# Patient Record
Sex: Female | Born: 1995 | Race: White | Hispanic: No | Marital: Single | Smoking: Never smoker
Health system: Southern US, Community
[De-identification: ages and names within clinical notes are randomized; demographics above are authoritative.]

## PROBLEM LIST (undated history)

## (undated) ENCOUNTER — Inpatient Hospital Stay (HOSPITAL_COMMUNITY): Payer: Self-pay

## (undated) DIAGNOSIS — Z789 Other specified health status: Secondary | ICD-10-CM

## (undated) HISTORY — PX: NO PAST SURGERIES: SHX2092

---
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
## 2013-01-22 ENCOUNTER — Institutional Professional Consult (permissible substitution) (INDEPENDENT_AMBULATORY_CARE_PROVIDER_SITE_OTHER): Payer: BC Managed Care – PPO | Admitting: Family

## 2013-01-22 DIAGNOSIS — F909 Attention-deficit hyperactivity disorder, unspecified type: Secondary | ICD-10-CM

## 2013-01-22 DIAGNOSIS — F411 Generalized anxiety disorder: Secondary | ICD-10-CM

## 2013-05-20 ENCOUNTER — Institutional Professional Consult (permissible substitution): Payer: BC Managed Care – PPO | Admitting: Family

## 2013-06-17 ENCOUNTER — Institutional Professional Consult (permissible substitution) (INDEPENDENT_AMBULATORY_CARE_PROVIDER_SITE_OTHER): Payer: BC Managed Care – PPO | Admitting: Family

## 2013-06-17 DIAGNOSIS — F909 Attention-deficit hyperactivity disorder, unspecified type: Secondary | ICD-10-CM

## 2013-09-09 ENCOUNTER — Institutional Professional Consult (permissible substitution) (INDEPENDENT_AMBULATORY_CARE_PROVIDER_SITE_OTHER): Payer: BC Managed Care – PPO | Admitting: Family

## 2013-09-09 DIAGNOSIS — F909 Attention-deficit hyperactivity disorder, unspecified type: Secondary | ICD-10-CM

## 2013-12-10 ENCOUNTER — Institutional Professional Consult (permissible substitution): Payer: BC Managed Care – PPO | Admitting: Family

## 2013-12-10 DIAGNOSIS — F909 Attention-deficit hyperactivity disorder, unspecified type: Secondary | ICD-10-CM

## 2014-03-04 ENCOUNTER — Institutional Professional Consult (permissible substitution): Payer: BC Managed Care – PPO | Admitting: Family

## 2014-03-04 DIAGNOSIS — F909 Attention-deficit hyperactivity disorder, unspecified type: Secondary | ICD-10-CM

## 2014-05-17 ENCOUNTER — Institutional Professional Consult (permissible substitution): Payer: BC Managed Care – PPO | Admitting: Family

## 2014-05-17 DIAGNOSIS — F909 Attention-deficit hyperactivity disorder, unspecified type: Secondary | ICD-10-CM

## 2014-08-17 ENCOUNTER — Institutional Professional Consult (permissible substitution): Payer: BC Managed Care – PPO | Admitting: Family

## 2014-08-17 DIAGNOSIS — F900 Attention-deficit hyperactivity disorder, predominantly inattentive type: Secondary | ICD-10-CM

## 2014-10-22 NOTE — L&D Delivery Note (Signed)
Patient was C/C/+3 and pushed for 120 minutes with epidural.    Pt was exhausted but pushed to +4/almost crowning.    Offered VE- all risks and benefits d/w pt and she accepted. VE placed and pulled with one contraction and no popoffs to delivery of head.  SVD  female infant, Apgars 8,9, weight P.   The patient had a 2nd degree laceration repaired with 2-0 vicryl R. Fundus was firm. EBL was expected amount-300cc. Placenta was delivered intact. Vagina was clear.  Baby was vigorous and doing skin to skin with mother.  HORVATH,MICHELLE A

## 2014-11-10 ENCOUNTER — Institutional Professional Consult (permissible substitution): Payer: BLUE CROSS/BLUE SHIELD | Admitting: Family

## 2014-11-10 DIAGNOSIS — F900 Attention-deficit hyperactivity disorder, predominantly inattentive type: Secondary | ICD-10-CM

## 2015-01-11 LAB — OB RESULTS CONSOLE RPR: RPR: NONREACTIVE

## 2015-01-11 LAB — OB RESULTS CONSOLE ANTIBODY SCREEN: Antibody Screen: NEGATIVE

## 2015-01-11 LAB — OB RESULTS CONSOLE HEPATITIS B SURFACE ANTIGEN: HEP B S AG: NEGATIVE

## 2015-01-11 LAB — OB RESULTS CONSOLE ABO/RH: RH TYPE: POSITIVE

## 2015-01-11 LAB — OB RESULTS CONSOLE HIV ANTIBODY (ROUTINE TESTING): HIV: NONREACTIVE

## 2015-01-11 LAB — OB RESULTS CONSOLE RUBELLA ANTIBODY, IGM: Rubella: IMMUNE

## 2015-05-25 ENCOUNTER — Encounter (HOSPITAL_COMMUNITY): Payer: Self-pay | Admitting: *Deleted

## 2015-05-25 ENCOUNTER — Inpatient Hospital Stay (HOSPITAL_COMMUNITY)
Admission: AD | Admit: 2015-05-25 | Discharge: 2015-05-25 | Disposition: A | Discharge status: Home / Self Care | Payer: 59 | Source: Ambulatory Visit | Attending: Obstetrics and Gynecology | Admitting: Obstetrics and Gynecology

## 2015-05-25 DIAGNOSIS — O9989 Other specified diseases and conditions complicating pregnancy, childbirth and the puerperium: Secondary | ICD-10-CM | POA: Insufficient documentation

## 2015-05-25 DIAGNOSIS — Z0371 Encounter for suspected problem with amniotic cavity and membrane ruled out: Secondary | ICD-10-CM | POA: Diagnosis not present

## 2015-05-25 DIAGNOSIS — Z3A33 33 weeks gestation of pregnancy: Secondary | ICD-10-CM | POA: Insufficient documentation

## 2015-05-25 LAB — AMNISURE RUPTURE OF MEMBRANE (ROM) NOT AT ARMC: AMNISURE: NEGATIVE

## 2015-05-25 LAB — URINALYSIS, ROUTINE W REFLEX MICROSCOPIC
Bilirubin Urine: NEGATIVE
Glucose, UA: NEGATIVE mg/dL
Hgb urine dipstick: NEGATIVE
KETONES UR: 15 mg/dL — AB
Leukocytes, UA: NEGATIVE
NITRITE: NEGATIVE
PH: 6 (ref 5.0–8.0)
PROTEIN: NEGATIVE mg/dL
Specific Gravity, Urine: 1.02 (ref 1.005–1.030)
UROBILINOGEN UA: 0.2 mg/dL (ref 0.0–1.0)

## 2015-05-25 NOTE — MAU Provider Note (Signed)
History     CSN: [id number]  Arrival date and time: 05/25/15 2006   First Provider Initiated Contact with Patient 05/25/15 2106      No chief complaint on file.  HPI Comments: Jane Burnett is a 19 y.o. G1P0 at [redacted]w[redacted]d who presents today with leaking of fluid. She states that around 1630 she had a gush of fluid and some time later she had another gush. She has not had any leaking since. She denies any vaginal bleeding. She states that the fetus has been moving normally. She denies any complications with the pregnancy. She has an appointment Friday in the office.    History reviewed. No pertinent past medical history.  History reviewed. No pertinent past surgical history.  Family History  Problem Relation Age of Onset  . Hypertension Maternal Grandfather   . Hypertension Mother   . Hypertension Maternal Grandmother     History  Substance Use Topics  . Smoking status: Never Smoker   . Smokeless tobacco: Never Used  . Alcohol Use: No    Allergies: No Known Allergies  Prescriptions prior to admission  Medication Sig Dispense Refill Last Dose  . acetaminophen (TYLENOL) 325 MG tablet Take 650 mg by mouth every 6 (six) hours as needed for mild pain or headache.   Past Month at Unknown time  . calcium carbonate (TUMS - DOSED IN MG ELEMENTAL CALCIUM) 500 MG chewable tablet Chew 2 tablets by mouth daily as needed for heartburn.   Past Month at Unknown time  . Prenatal Vit-Fe Fumarate-FA (MULTIVITAMIN-PRENATAL) 27-0.8 MG TABS tablet Take 1 tablet by mouth daily at 12 noon.   05/25/2015 at 0530    Review of Systems  Constitutional: Negative for fever.  Gastrointestinal: Negative for nausea, vomiting and abdominal pain.  Genitourinary: Positive for frequency. Negative for dysuria and urgency.   Physical Exam   Blood pressure 127/75, pulse 91, temperature 99.1 F (37.3 C), temperature source Oral, resp. rate 16, height 5\' 6"  (1.676 m), weight 86.183 kg (190 lb), SpO2 98 %.  Physical  Exam  Nursing note and vitals reviewed. Constitutional: She is oriented to person, place, and time. She appears well-developed and well-nourished.  HENT:  Head: Normocephalic.  Respiratory: Effort normal.  GI: Soft. There is no tenderness.  Genitourinary:   Cervix:closed/thick/-3  Neurological: She is alert and oriented to person, place, and time.  Skin: Skin is warm and dry.  Psychiatric: She has a normal mood and affect.   Results for orders placed or performed during the hospital encounter of 05/25/15 (from the past 24 hour(s))  Urinalysis, Routine w reflex microscopic (not at [facility])     Status: Abnormal   Collection Time: 05/25/15  8:20 PM  Result Value Ref Range   Color, Urine YELLOW YELLOW   APPearance CLEAR CLEAR   Specific Gravity, Urine 1.020 1.005 - 1.030   pH 6.0 5.0 - 8.0   Glucose, UA NEGATIVE NEGATIVE mg/dL   Hgb urine dipstick NEGATIVE NEGATIVE   Bilirubin Urine NEGATIVE NEGATIVE   Ketones, ur 15 (A) NEGATIVE mg/dL   Protein, ur NEGATIVE NEGATIVE mg/dL   Urobilinogen, UA 0.2 0.0 - 1.0 mg/dL   Nitrite NEGATIVE NEGATIVE   Leukocytes, UA NEGATIVE NEGATIVE  Amnisure rupture of membrane (rom)not at [facility]     Status: None   Collection Time: 05/25/15  9:13 PM  Result Value Ref Range   Amnisure ROM NEGATIVE    FHT: 150, moderate with 15x15 accels, no decels Toco: no UCs  MAU Course  Procedures  MDM 2151: D/W Dr. Henderson Cloud, ok for DC home.   Assessment and Plan   1. Encounter for suspected premature rupture of membranes, with rupture of membranes not found   2. [redacted] weeks gestation of pregnancy    DC home Comfort measures reviewed  3rd Trimester precautions  PTL precautions  Fetal kick counts RX: none  Return to MAU as needed FU with OB as planned  Follow-up Information    Follow up with Naia Ruff A, MD.   Specialty:  Obstetrics and Gynecology   Why:  As scheduled   Contact information:   [address] [place]  [address] [phone]         [name] 05/25/2015, 9:08 PM

## 2015-05-25 NOTE — Discharge Instructions (Signed)

## 2015-05-25 NOTE — MAU Note (Signed)
Pt reports for the last 3 days she has had some type of fluid leaking out. State today today it has happened more often and a lot more fluid came out. Sometimes has sharp pains in vaginal area. Denies bleeding.

## 2015-06-08 LAB — OB RESULTS CONSOLE GC/CHLAMYDIA
Chlamydia: NEGATIVE
Gonorrhea: NEGATIVE

## 2015-06-08 LAB — OB RESULTS CONSOLE GBS: STREP GROUP B AG: NEGATIVE

## 2015-07-15 ENCOUNTER — Encounter (HOSPITAL_COMMUNITY): Payer: Self-pay | Admitting: *Deleted

## 2015-07-15 ENCOUNTER — Inpatient Hospital Stay (HOSPITAL_COMMUNITY)
Admission: AD | Admit: 2015-07-15 | Discharge: 2015-07-18 | DRG: 775 | Disposition: A | Discharge status: Home / Self Care | Payer: 59 | Source: Ambulatory Visit | Attending: Obstetrics and Gynecology | Admitting: Obstetrics and Gynecology

## 2015-07-15 DIAGNOSIS — O480 Post-term pregnancy: Secondary | ICD-10-CM | POA: Diagnosis not present

## 2015-07-15 DIAGNOSIS — O4292 Full-term premature rupture of membranes, unspecified as to length of time between rupture and onset of labor: Secondary | ICD-10-CM | POA: Diagnosis present

## 2015-07-15 DIAGNOSIS — Z3A40 40 weeks gestation of pregnancy: Secondary | ICD-10-CM | POA: Diagnosis present

## 2015-07-15 HISTORY — DX: Other specified health status: Z78.9

## 2015-07-15 LAB — CBC
HCT: 37.1 % (ref 36.0–46.0)
Hemoglobin: 13.1 g/dL (ref 12.0–15.0)
MCH: 32 pg (ref 26.0–34.0)
MCHC: 35.3 g/dL (ref 30.0–36.0)
MCV: 90.7 fL (ref 78.0–100.0)
PLATELETS: 152 10*3/uL (ref 150–400)
RBC: 4.09 MIL/uL (ref 3.87–5.11)
RDW: 13.3 % (ref 11.5–15.5)
WBC: 12.8 10*3/uL — AB (ref 4.0–10.5)

## 2015-07-15 LAB — TYPE AND SCREEN
ABO/RH(D): A POS
Antibody Screen: NEGATIVE

## 2015-07-15 MED ORDER — LACTATED RINGERS IV SOLN
INTRAVENOUS | Status: DC
  Administered 2015-07-15: 125 mL/h via INTRAVENOUS
  Administered 2015-07-16: 13:00:00 via INTRAVENOUS

## 2015-07-15 NOTE — MAU Note (Signed)
Contractions since 2000. Some bloody show. Cervix 1.5cm last ck.

## 2015-07-16 ENCOUNTER — Encounter (HOSPITAL_COMMUNITY): Payer: Self-pay | Admitting: Emergency Medicine

## 2015-07-16 ENCOUNTER — Inpatient Hospital Stay (HOSPITAL_COMMUNITY): Payer: 59 | Admitting: Anesthesiology

## 2015-07-16 DIAGNOSIS — Z3A40 40 weeks gestation of pregnancy: Secondary | ICD-10-CM | POA: Diagnosis present

## 2015-07-16 DIAGNOSIS — O480 Post-term pregnancy: Secondary | ICD-10-CM | POA: Diagnosis present

## 2015-07-16 DIAGNOSIS — O4292 Full-term premature rupture of membranes, unspecified as to length of time between rupture and onset of labor: Secondary | ICD-10-CM | POA: Diagnosis present

## 2015-07-16 LAB — RPR: RPR Ser Ql: NONREACTIVE

## 2015-07-16 LAB — ABO/RH: ABO/RH(D): A POS

## 2015-07-16 MED ORDER — DIPHENHYDRAMINE HCL 25 MG PO CAPS: 25.0000 mg | ORAL_CAPSULE | Freq: Four times a day (QID) | ORAL | Status: DC | PRN

## 2015-07-16 MED ORDER — ONDANSETRON HCL 4 MG PO TABS: 4.0000 mg | ORAL_TABLET | ORAL | Status: DC | PRN

## 2015-07-16 MED ORDER — DIPHENHYDRAMINE HCL 50 MG/ML IJ SOLN: 12.5000 mg | INTRAMUSCULAR | Status: DC | PRN

## 2015-07-16 MED ORDER — LANOLIN HYDROUS EX OINT: TOPICAL_OINTMENT | CUTANEOUS | Status: DC | PRN

## 2015-07-16 MED ORDER — EPHEDRINE 5 MG/ML INJ
10.0000 mg | INTRAVENOUS | Status: DC | PRN
  Filled 2015-07-16: qty 2

## 2015-07-16 MED ORDER — SODIUM CHLORIDE 0.9 % IV SOLN
2.0000 g | Freq: Once | INTRAVENOUS | Status: AC
  Administered 2015-07-16: 2 g via INTRAVENOUS
  Filled 2015-07-16: qty 2000

## 2015-07-16 MED ORDER — ZOLPIDEM TARTRATE 5 MG PO TABS: 5.0000 mg | ORAL_TABLET | Freq: Every evening | ORAL | Status: DC | PRN

## 2015-07-16 MED ORDER — SODIUM CHLORIDE 0.9 % IJ SOLN: 3.0000 mL | INTRAMUSCULAR | Status: DC | PRN

## 2015-07-16 MED ORDER — SODIUM CHLORIDE 0.9 % IJ SOLN: 3.0000 mL | Freq: Two times a day (BID) | INTRAMUSCULAR | Status: DC

## 2015-07-16 MED ORDER — FLEET ENEMA 7-19 GM/118ML RE ENEM: 1.0000 | ENEMA | RECTAL | Status: DC | PRN

## 2015-07-16 MED ORDER — BUPIVACAINE HCL (PF) 0.25 % IJ SOLN
INTRAMUSCULAR | Status: DC | PRN
  Administered 2015-07-16: 4 mL via EPIDURAL
  Administered 2015-07-16: 6 mL via EPIDURAL
  Administered 2015-07-16: 4 mL via EPIDURAL

## 2015-07-16 MED ORDER — SODIUM CHLORIDE 0.9 % IV SOLN: 250.0000 mL | INTRAVENOUS | Status: DC | PRN

## 2015-07-16 MED ORDER — CITRIC ACID-SODIUM CITRATE 334-500 MG/5ML PO SOLN: 30.0000 mL | ORAL | Status: DC | PRN

## 2015-07-16 MED ORDER — FERROUS SULFATE 325 (65 FE) MG PO TABS
325.0000 mg | ORAL_TABLET | Freq: Two times a day (BID) | ORAL | Status: DC
  Administered 2015-07-17 – 2015-07-18 (×3): 325 mg via ORAL
  Filled 2015-07-16 (×3): qty 1

## 2015-07-16 MED ORDER — IBUPROFEN 800 MG PO TABS
800.0000 mg | ORAL_TABLET | Freq: Three times a day (TID) | ORAL | Status: DC
  Administered 2015-07-16 – 2015-07-18 (×6): 800 mg via ORAL
  Filled 2015-07-16 (×6): qty 1

## 2015-07-16 MED ORDER — FENTANYL 2.5 MCG/ML BUPIVACAINE 1/10 % EPIDURAL INFUSION (WH - ANES)
INTRAMUSCULAR | Status: AC
  Administered 2015-07-16: 14 mL/h via EPIDURAL
  Filled 2015-07-16: qty 125

## 2015-07-16 MED ORDER — OXYCODONE-ACETAMINOPHEN 5-325 MG PO TABS: 2.0000 | ORAL_TABLET | ORAL | Status: DC | PRN

## 2015-07-16 MED ORDER — FENTANYL 2.5 MCG/ML BUPIVACAINE 1/10 % EPIDURAL INFUSION (WH - ANES)
14.0000 mL/h | INTRAMUSCULAR | Status: DC | PRN
  Administered 2015-07-16 (×3): 14 mL/h via EPIDURAL
  Filled 2015-07-16: qty 125

## 2015-07-16 MED ORDER — LACTATED RINGERS IV SOLN: 500.0000 mL | INTRAVENOUS | Status: DC | PRN

## 2015-07-16 MED ORDER — BUTORPHANOL TARTRATE 1 MG/ML IJ SOLN
1.0000 mg | INTRAMUSCULAR | Status: DC | PRN
  Administered 2015-07-16 (×2): 1 mg via INTRAVENOUS
  Filled 2015-07-16: qty 1

## 2015-07-16 MED ORDER — ACETAMINOPHEN 325 MG PO TABS: 650.0000 mg | ORAL_TABLET | ORAL | Status: DC | PRN

## 2015-07-16 MED ORDER — SENNOSIDES-DOCUSATE SODIUM 8.6-50 MG PO TABS
2.0000 | ORAL_TABLET | ORAL | Status: DC
  Administered 2015-07-16 – 2015-07-17 (×2): 2 via ORAL
  Filled 2015-07-16 (×2): qty 2

## 2015-07-16 MED ORDER — MAGNESIUM HYDROXIDE 400 MG/5ML PO SUSP: 30.0000 mL | ORAL | Status: DC | PRN

## 2015-07-16 MED ORDER — BUTORPHANOL TARTRATE 1 MG/ML IJ SOLN
INTRAMUSCULAR | Status: AC
  Filled 2015-07-16: qty 1

## 2015-07-16 MED ORDER — TETANUS-DIPHTH-ACELL PERTUSSIS 5-2.5-18.5 LF-MCG/0.5 IM SUSP: 0.5000 mL | Freq: Once | INTRAMUSCULAR | Status: DC

## 2015-07-16 MED ORDER — OXYTOCIN 40 UNITS IN LACTATED RINGERS INFUSION - SIMPLE MED
62.5000 mL/h | INTRAVENOUS | Status: DC
  Administered 2015-07-16: 62.5 mL/h via INTRAVENOUS
  Filled 2015-07-16: qty 1000

## 2015-07-16 MED ORDER — LIDOCAINE HCL (PF) 1 % IJ SOLN
30.0000 mL | INTRAMUSCULAR | Status: AC | PRN
  Administered 2015-07-16: 30 mL via SUBCUTANEOUS
  Filled 2015-07-16: qty 30

## 2015-07-16 MED ORDER — ONDANSETRON HCL 4 MG/2ML IJ SOLN
4.0000 mg | Freq: Four times a day (QID) | INTRAMUSCULAR | Status: DC | PRN
  Administered 2015-07-16: 4 mg via INTRAVENOUS
  Filled 2015-07-16: qty 2

## 2015-07-16 MED ORDER — FENTANYL CITRATE (PF) 100 MCG/2ML IJ SOLN
INTRAMUSCULAR | Status: AC
  Administered 2015-07-16: 100 ug via EPIDURAL
  Filled 2015-07-16: qty 2

## 2015-07-16 MED ORDER — METHYLERGONOVINE MALEATE 0.2 MG/ML IJ SOLN: 0.2000 mg | INTRAMUSCULAR | Status: DC | PRN

## 2015-07-16 MED ORDER — SIMETHICONE 80 MG PO CHEW: 80.0000 mg | CHEWABLE_TABLET | ORAL | Status: DC | PRN

## 2015-07-16 MED ORDER — PHENYLEPHRINE 40 MCG/ML (10ML) SYRINGE FOR IV PUSH (FOR BLOOD PRESSURE SUPPORT)
80.0000 ug | PREFILLED_SYRINGE | INTRAVENOUS | Status: DC | PRN
  Filled 2015-07-16: qty 2

## 2015-07-16 MED ORDER — MEASLES, MUMPS & RUBELLA VAC ~~LOC~~ INJ
0.5000 mL | INJECTION | Freq: Once | SUBCUTANEOUS | Status: DC
  Filled 2015-07-16: qty 0.5

## 2015-07-16 MED ORDER — DIBUCAINE 1 % RE OINT: 1.0000 "application " | TOPICAL_OINTMENT | RECTAL | Status: DC | PRN

## 2015-07-16 MED ORDER — METHYLERGONOVINE MALEATE 0.2 MG PO TABS: 0.2000 mg | ORAL_TABLET | ORAL | Status: DC | PRN

## 2015-07-16 MED ORDER — WITCH HAZEL-GLYCERIN EX PADS: 1.0000 "application " | MEDICATED_PAD | CUTANEOUS | Status: DC | PRN

## 2015-07-16 MED ORDER — OXYTOCIN BOLUS FROM INFUSION
500.0000 mL | INTRAVENOUS | Status: DC
  Administered 2015-07-16: 500 mL via INTRAVENOUS

## 2015-07-16 MED ORDER — BENZOCAINE-MENTHOL 20-0.5 % EX AERO
1.0000 "application " | INHALATION_SPRAY | CUTANEOUS | Status: DC | PRN
  Administered 2015-07-16: 1 via TOPICAL
  Filled 2015-07-16: qty 56

## 2015-07-16 MED ORDER — OXYCODONE-ACETAMINOPHEN 5-325 MG PO TABS: 1.0000 | ORAL_TABLET | ORAL | Status: DC | PRN

## 2015-07-16 MED ORDER — ONDANSETRON HCL 4 MG/2ML IJ SOLN: 4.0000 mg | INTRAMUSCULAR | Status: DC | PRN

## 2015-07-16 MED ORDER — PRENATAL MULTIVITAMIN CH
1.0000 | ORAL_TABLET | Freq: Every day | ORAL | Status: DC
  Administered 2015-07-17: 1 via ORAL
  Filled 2015-07-16: qty 1

## 2015-07-16 MED ORDER — PHENYLEPHRINE 40 MCG/ML (10ML) SYRINGE FOR IV PUSH (FOR BLOOD PRESSURE SUPPORT)
PREFILLED_SYRINGE | INTRAVENOUS | Status: AC
  Filled 2015-07-16: qty 20

## 2015-07-16 MED ORDER — ACETAMINOPHEN 325 MG PO TABS
650.0000 mg | ORAL_TABLET | ORAL | Status: DC | PRN
  Administered 2015-07-16: 650 mg via ORAL
  Filled 2015-07-16: qty 2

## 2015-07-16 MED ORDER — OXYCODONE-ACETAMINOPHEN 5-325 MG PO TABS
1.0000 | ORAL_TABLET | ORAL | Status: DC | PRN
  Administered 2015-07-17: 1 via ORAL
  Filled 2015-07-16: qty 1

## 2015-07-16 MED ORDER — LIDOCAINE-EPINEPHRINE (PF) 2 %-1:200000 IJ SOLN
INTRAMUSCULAR | Status: DC | PRN
  Administered 2015-07-16: 4 mL

## 2015-07-16 NOTE — Anesthesia Procedure Notes (Signed)
Epidural Patient location during procedure: OB  Staffing Anesthesiologist: MOSER, CHRISTOPHER Performed by: anesthesiologist   Preanesthetic Checklist Completed: patient identified, surgical consent, pre-op evaluation, timeout performed, IV checked, risks and benefits discussed and monitors and equipment checked  Epidural Patient position: sitting Prep: DuraPrep Patient monitoring: heart rate, cardiac monitor, continuous pulse ox and blood pressure Approach: midline Location: L3-L4 Injection technique: LOR saline  Needle:  Needle type: Tuohy  Needle gauge: 17 G Needle length: 9 cm Needle insertion depth: 6 cm Catheter type: closed end flexible Catheter size: 19 Gauge Catheter at skin depth: 12 cm Test dose: negative and 2% lidocaine with Epi 1:200 K  Assessment Events: blood not aspirated, injection not painful, no injection resistance, negative IV test and no paresthesia  Additional Notes Reason for block:procedure for pain   

## 2015-07-16 NOTE — Anesthesia Preprocedure Evaluation (Signed)
Anesthesia Evaluation  Patient identified by MRN, date of birth, ID band Patient awake    Reviewed: Allergy & Precautions, NPO status , Patient's Chart, lab work & pertinent test results  History of Anesthesia Complications Negative for: history of anesthetic complications  Airway Mallampati: II  TM Distance: >3 FB Neck ROM: Full    Dental  (+) Teeth Intact   Pulmonary neg pulmonary ROS,    breath sounds clear to auscultation       Cardiovascular negative cardio ROS   Rhythm:Regular     Neuro/Psych negative neurological ROS  negative psych ROS   GI/Hepatic negative GI ROS, Neg liver ROS,   Endo/Other  negative endocrine ROS  Renal/GU negative Renal ROS     Musculoskeletal   Abdominal   Peds  Hematology negative hematology ROS (+)   Anesthesia Other Findings   Reproductive/Obstetrics (+) Pregnancy                             Anesthesia Physical Anesthesia Plan  ASA: II  Anesthesia Plan: Epidural   Post-op Pain Management:    Induction:   Airway Management Planned:   Additional Equipment: None  Intra-op Plan:   Post-operative Plan:   Informed Consent: I have reviewed the patients History and Physical, chart, labs and discussed the procedure including the risks, benefits and alternatives for the proposed anesthesia with the patient or authorized representative who has indicated his/her understanding and acceptance.     Plan Discussed with: Anesthesiologist  Anesthesia Plan Comments:         Anesthesia Quick Evaluation

## 2015-07-16 NOTE — MAU Note (Signed)
Report called to Dan Europe RN in [place]. Ok for pt to come to 2

## 2015-07-16 NOTE — H&P (Signed)
19 y.o. [redacted]w[redacted]d  G1P0 comes in c/o labor.  Otherwise has good fetal movement and no bleeding.  Past Medical History  Diagnosis Date  . Medical history non-contributory     Past Surgical History  Procedure Laterality Date  . No past surgeries      OB History  Gravida Para Term Preterm AB SAB TAB Ectopic Multiple Living  1             # Outcome Date GA Lbr Len/2nd Weight Sex Delivery Anes PTL Lv  1 Current               Social History   Social History  . Marital Status: Single    Spouse Name: N/A  . Number of Children: N/A  . Years of Education: N/A   Occupational History  . Not on file.   Social History Main Topics  . Smoking status: Never Smoker   . Smokeless tobacco: Never Used  . Alcohol Use: No  . Drug Use: No  . Sexual Activity: Yes   Other Topics Concern  . Not on file   Social History Narrative   Review of patient's allergies indicates no known allergies.    Prenatal Transfer Tool  Maternal Diabetes: No Genetic Screening: Declined Maternal Ultrasounds/Referrals: Normal Fetal Ultrasounds or other Referrals:  None Maternal Substance Abuse:  No Significant Maternal Medications:  None Significant Maternal Lab Results: None  Other PNC: uncomplicated.    Filed Vitals:   07/16/15 0731  BP: 100/77  Pulse: 107  Temp:   Resp: 18     Lungs/Cor:  NAD Abdomen:  soft, gravid Ex:  no cords, erythema SVE:  6-7/80/0 FHTs:  140, good STV, NST R Toco:  q 4-5   A/P   Term labor.  GBS neg.  Jane Burnett A

## 2015-07-16 NOTE — Progress Notes (Signed)
Pt 9/C/-1, AROM clear.

## 2015-07-16 NOTE — Lactation Note (Signed)
This note was copied from the chart of Jane Burnett. Lactation Consultation Note  Initial Consultation G1 mom of 9 hour old baby Jane [place]. Mom reports she is doing well. Patsey Berthold has had 2 BF and 1 attempt. She has had 2 stools and 0 voids. Mom says she just fed and had a bath and dad was doing STS. Mom denies soreness. Parents did attend BF classes. BF basics reviewed. Baby was sucking on a pacifier, encouraged family to avoid pacifiers until BF well established and enc family to feed infant 8-12 x in 24 hours at first feeding cues and to keep I/O record. [facility] Brochure given with [facility] phone #. Informed parents of LC Resources, Support Groups and OP services. Enc parents to call for questions/concerns.   Patient Name: Jane Burnett [id number] Date: 07/16/2015 Reason for consult: Initial assessment   Maternal Data Formula Feeding for Exclusion: No Does the patient have breastfeeding experience prior to this delivery?: No  Feeding Feeding Type: Breast Fed Length of feed: 30 min  LATCH Score/Interventions                      Lactation Tools Discussed/Used     Consult Status Consult Status: Follow-up Date: 07/17/15 Follow-up type: In-patient    Silas Flood Hice 07/16/2015, 11:01 PM

## 2015-07-17 LAB — CBC
HCT: 28.2 % — ABNORMAL LOW (ref 36.0–46.0)
Hemoglobin: 9.8 g/dL — ABNORMAL LOW (ref 12.0–15.0)
MCH: 32 pg (ref 26.0–34.0)
MCHC: 34.8 g/dL (ref 30.0–36.0)
MCV: 92.2 fL (ref 78.0–100.0)
PLATELETS: 139 10*3/uL — AB (ref 150–400)
RBC: 3.06 MIL/uL — ABNORMAL LOW (ref 3.87–5.11)
RDW: 13.6 % (ref 11.5–15.5)
WBC: 19.2 10*3/uL — ABNORMAL HIGH (ref 4.0–10.5)

## 2015-07-17 NOTE — Anesthesia Postprocedure Evaluation (Signed)
Anesthesia Post Note  Patient: Jane Burnett  Procedure(s) Performed: * No procedures listed *  Anesthesia type: Epidural  Patient location: Mother/Baby  Post pain: Pain level controlled  Post assessment: Post-op Vital signs reviewed  Last Vitals:  Filed Vitals:   07/17/15 0600  BP: 108/66  Pulse: 85  Temp: 36.8 C  Resp: 18    Post vital signs: Reviewed  Level of consciousness:alert  Complications: No apparent anesthesia complications

## 2015-07-17 NOTE — Progress Notes (Signed)
Patient is eating, ambulating, voiding.  Pain control is good.  Filed Vitals:   07/16/15 1502 07/16/15 1535 07/16/15 1640 07/16/15 2130  BP: 105/62 102/63 105/63 103/60  Pulse: 89 72 69 83  Temp: 99.5 F (37.5 C) 98.4 F (36.9 C) 98.6 F (37 C) 98.8 F (37.1 C)  TempSrc: Oral Oral Oral Oral  Resp: SpO2:        Fundus firm Perineum without swelling.  Lab Results  Component Value Date   WBC 12.8* 07/15/2015   HGB 13.1 07/15/2015   HCT 37.1 07/15/2015   MCV 90.7 07/15/2015   PLT 152 07/15/2015    --/--/A POS, A POS (09/23 2240)/RI  A/P Post partum day 1.  Routine care.  Expect d/c routine.    Aubrianne Molyneux A

## 2015-07-17 NOTE — Discharge Summary (Signed)
Obstetric Discharge Summary Reason for Admission: onset of labor Prenatal Procedures: none Intrapartum Procedures: vacuum Postpartum Procedures: none Complications-Operative and Postpartum: 2 degree perineal laceration HEMOGLOBIN  Date Value Ref Range Status  07/17/2015 9.8* 12.0 - 15.0 g/dL Final    Comment:    REPEATED TO VERIFY DELTA CHECK NOTED    HCT  Date Value Ref Range Status  07/17/2015 28.2* 36.0 - 46.0 % Final   Discharge Diagnoses: Term Pregnancy-delivered  Discharge Information: Date: 07/17/2015 Activity: pelvic rest Diet: routine Medications: Ibuprofen and Iron Condition: stable Instructions: refer to practice specific booklet Discharge to: home Follow-up Information    Follow up with HORVATH,MICHELLE A, MD In 4 weeks.   Specialty:  Obstetrics and Gynecology   Contact information:   [address] [place] [address] [phone]       Newborn Data: Live born female  Birth Weight: 8 lb 3.4 oz (3725 g) APGAR: 8, 9  Home with mother.  HORVATH,MICHELLE A 07/17/2015, 6:24 AM

## 2015-07-18 MED ORDER — IBUPROFEN 600 MG PO TABS: 600.0000 mg | ORAL_TABLET | Freq: Four times a day (QID) | ORAL | Status: DC | PRN

## 2015-07-18 MED ORDER — OXYCODONE-ACETAMINOPHEN 5-325 MG PO TABS: 2.0000 | ORAL_TABLET | ORAL | Status: DC | PRN

## 2015-07-18 MED ORDER — DOCUSATE SODIUM 100 MG PO CAPS: 100.0000 mg | ORAL_CAPSULE | Freq: Two times a day (BID) | ORAL | Status: DC

## 2015-07-18 NOTE — Lactation Note (Signed)
This note was copied from the chart of Jane Teresa Nicodemus. Lactation Consultation Note Has has extremely sore bruised cracked nipples w/scab from bleeding to Lt. Nipple. Rt. Nipple has positional stripe. Mom stated she has to give them a rest. Has large breast w/easy flow of colostrum that is starting to fill breast. Discussed supply and demand, formula cuts back on milk supply, hand expression and giving baby her colostrum instead of formula for supplementing. Baby was sucking on pacifier. Discouraged pacifiers at this time.  Gave shells to wear in bra to assist in everting more and also help w/sore nipples. Discussed nipple shield d/t sore nipple.  Baby has a small mouth, upper lip labial frenulum. Tongue moves out past lips out of mouth. Has a tight suckle on gloved finger. Discussed positioning and latching. Offered to assist in latch, mom stated she was letting them rest now d/t pain.  Encouraged to call for assistance in feeding to help latch. Dad fed baby 10 ml colostrum in bottle and then 10 ml formula. Encouraged to always give colostrum first. Mom using comfort gels. Patient Name: Jane Burnett [id number] Date: 07/18/2015 Reason for consult: Follow-up assessment;Breast/nipple pain   Maternal Data    Feeding Feeding Type: Breast Milk with Formula added Nipple Type: Slow - flow  LATCH Score/Interventions Latch: Grasps breast easily, tongue down, lips flanged, rhythmical sucking. Intervention(s): Breast massage;Breast compression  Audible Swallowing: Spontaneous and intermittent Intervention(s): Hand expression  Type of Nipple: Everted at rest and after stimulation  Comfort (Breast/Nipple): Filling, red/small blisters or bruises, mild/mod discomfort Problem noted: Cracked, bleeding, blisters, bruises Intervention(s): Expressed breast milk to nipple  Problem noted: Cracked, bleeding, blisters, bruises;Severe discomfort Interventions  (Cracked/bleeding/bruising/blister):  Expressed breast milk to nipple Interventions (Mild/moderate discomfort): Comfort gels;Hand massage;Hand expression  Hold (Positioning): Assistance needed to correctly position infant at breast and maintain latch. Intervention(s): Breastfeeding basics reviewed;Support Pillows;Position options;Skin to skin (sidelying due to sacral discomfort)  LATCH Score: 8  Lactation Tools Discussed/Used Tools: Shells;Pump;Comfort gels Shell Type: Inverted Breast pump type: Manual   Consult Status Consult Status: Follow-up Date: 07/18/15 Follow-up type: In-patient    Charyl Dancer 07/18/2015, 3:35 AM

## 2015-07-18 NOTE — Lactation Note (Signed)
This note was copied from the chart of Jane Burnett. Lactation Consultation Note: Mother was breastfeeding on the RT breast when I arrived in the room. Mother denies having any pain with latch or feeding. Assessed infants latch . She has a wide open gape. Observed frequent swallows. Mother states that she has a crack on the left nipple . Observed that she has a positional strip and a crack. Mother has not breastfed infant on the left breast for several feedings. Advised mother to pump for 15-20 mins on affected breast every 2-3 hours. Mother has a hand pump and an electric pump. She was advised to phone her OB on discharge and request APNO. Mother is using comfort gels at this time. She also has shells at the bedside. Mother was receptive to all teaching. Advised mother to page for [facility] assistance if she wants to try and latch to the affected breast. Mother also advised to continue to breastfeed infant 8-12 times in 24 hours and with feeding cues. Discussed cluster feeding. Mother is aware of available LC services and community support.   Patient Name: Jane BurnettU Date: 07/18/2015 Reason for consult: Follow-up assessment   Maternal Data    Feeding Feeding Type: Breast Fed Length of feed: 45 min  LATCH Score/Interventions Latch: Grasps breast easily, tongue down, lips flanged, rhythmical sucking.  Audible Swallowing: Spontaneous and intermittent  Type of Nipple: Everted at rest and after stimulation  Comfort (Breast/Nipple): Filling, red/small blisters or bruises, mild/mod discomfort Problem noted: Cracked, bleeding, blisters, bruises Intervention(s): Expressed breast milk to nipple;Double electric pump  Problem noted: Cracked, bleeding, blisters, bruises;Mild/Moderate discomfort Interventions  (Cracked/bleeding/bruising/blister): Expressed breast milk to nipple Interventions (Mild/moderate discomfort): Hand massage;Comfort gels  Hold (Positioning): No assistance  needed to correctly position infant at breast.  LATCH Score: 9  Lactation Tools Discussed/Used     Consult Status Consult Status: Complete    Michel Bickers 07/18/2015, 2:32 PM

## 2015-12-22 DIAGNOSIS — F900 Attention-deficit hyperactivity disorder, predominantly inattentive type: Secondary | ICD-10-CM | POA: Diagnosis not present

## 2016-02-15 DIAGNOSIS — Z30431 Encounter for routine checking of intrauterine contraceptive device: Secondary | ICD-10-CM | POA: Diagnosis not present

## 2016-03-02 DIAGNOSIS — F900 Attention-deficit hyperactivity disorder, predominantly inattentive type: Secondary | ICD-10-CM | POA: Diagnosis not present

## 2016-03-23 DIAGNOSIS — F900 Attention-deficit hyperactivity disorder, predominantly inattentive type: Secondary | ICD-10-CM | POA: Diagnosis not present

## 2016-03-23 DIAGNOSIS — F329 Major depressive disorder, single episode, unspecified: Secondary | ICD-10-CM | POA: Diagnosis not present

## 2016-03-23 DIAGNOSIS — F411 Generalized anxiety disorder: Secondary | ICD-10-CM | POA: Diagnosis not present

## 2016-04-27 ENCOUNTER — Ambulatory Visit (INDEPENDENT_AMBULATORY_CARE_PROVIDER_SITE_OTHER): Payer: 59 | Admitting: Osteopathic Medicine

## 2016-04-27 VITALS — BP 110/78 | HR 80 | Temp 98.6°F | Resp 18 | Ht 66.0 in | Wt 185.0 lb

## 2016-04-27 DIAGNOSIS — N3090 Cystitis, unspecified without hematuria: Secondary | ICD-10-CM

## 2016-04-27 DIAGNOSIS — J029 Acute pharyngitis, unspecified: Secondary | ICD-10-CM

## 2016-04-27 DIAGNOSIS — R300 Dysuria: Secondary | ICD-10-CM

## 2016-04-27 LAB — POCT URINALYSIS DIPSTICK
BILIRUBIN UA: NEGATIVE
GLUCOSE UA: NEGATIVE
Ketones, UA: NEGATIVE
Leukocytes, UA: NEGATIVE
Nitrite, UA: NEGATIVE
Protein, UA: NEGATIVE
RBC UA: NEGATIVE
SPEC GRAV UA: 1.02
Urobilinogen, UA: 1
pH, UA: 7.5

## 2016-04-27 LAB — POCT RAPID STREP A (OFFICE): Rapid Strep A Screen: POSITIVE — AB

## 2016-04-27 MED ORDER — CEPHALEXIN 500 MG PO TABS: 500.0000 mg | ORAL_TABLET | Freq: Three times a day (TID) | ORAL | Status: DC

## 2016-04-27 NOTE — Progress Notes (Signed)
HPI: Jane Burnett is a 20 y.o. female who [facility] for chief complaint of:  Chief Complaint  Patient presents with  . Urinary Frequency    burning  . Sore Throat    Sore throat . Location: throat . Quality: Sore/scratchy . Duration: About 2 days . Assoc signs/symptoms: No fever or chills, no sinus drainage or congestion, no cough  Urinary frequency . Context: Overall getting better, this problem is of secondary concern her sore throat is her main concern today . Quality: Getting better over the past few days, has been using Azo . Duration: 3-4 days . Assoc signs/symptoms: No hematuria, no abnormal vaginal discharge, no lower abdominal pain   Past medical, social and family history reviewed: Past Medical History  Diagnosis Date  . Medical history non-contributory    Past Surgical History  Procedure Laterality Date  . No past surgeries     Social History  Substance Use Topics  . Smoking status: Never Smoker   . Smokeless tobacco: Never Used  . Alcohol Use: No   Family History  Problem Relation Age of Onset  . Hypertension Maternal Grandfather   . Hypertension Mother   . Hypertension Maternal Grandmother     Current Outpatient Prescriptions  Medication Sig Dispense Refill  . dexmethylphenidate (FOCALIN XR) 20 MG 24 hr capsule Take 20 mg by mouth daily.    Marland Kitchen. escitalopram (LEXAPRO) 10 MG tablet Take 10 mg by mouth daily.    Marland Kitchen. oxyCODONE-acetaminophen (ROXICET) 5-325 MG per tablet Take 2 tablets by mouth every 4 (four) hours as needed. May take 1-2 tablets every 4-6 hours as needed for pain (Patient not taking: Reported on 04/27/2016) 30 tablet 0   No current facility-administered medications for this visit.   No Known Allergies    Review of Systems: CONSTITUTIONAL:  No  fever, no chills, No  unintentional weight changes HEAD/EYES/EARS/NOSE/THROAT: No  headache, no vision change, no hearing change, (+) sore throat,  No  sinus pressure CARDIAC: No  chest pain, No  pressure RESPIRATORY: No  cough, No  shortness of breath/wheeze GASTROINTESTINAL: No  nausea, No  vomiting, No  abdominal pain  MUSCULOSKELETAL: No  myalgia/arthralgia GENITOURINARY: No  incontinence, No  abnormal genital bleeding/discharge, (+) increased urinary frequency SKIN: No  rash/wounds/concerning lesions  Exam:  BP 110/78 mmHg  Pulse 80  Temp(Src) 98.6 F (37 C) (Oral)  Resp 18  Ht 5\' 6"  (1.676 m)  Wt 185 lb (83.915 kg)  BMI 29.87 kg/m2  SpO2 99%  LMP 04/16/2016 Constitutional: VS see above. General Appearance: alert, well-developed, well-nourished, NAD Eyes: Normal lids and conjunctive, non-icteric sclera, PERRLA Ears, Nose, Mouth, Throat: MMM, Normal external inspection ears/nares/mouth/lips/gums, TM normal bilaterally. Pharynx (+)erythema, (+) scant exudate.  Neck: No masses, trachea midline. No thyroid enlargement/tenderness/mass appreciated. (+) Tender anterior cervical lymph nodes but no significant lymphadenopathy Respiratory: Normal respiratory effort. no wheeze, no rhonchi, no rales Cardiovascular: S1/S2 normal, no murmur, no rub/gallop auscultated. RRR.    Results for orders placed or performed in visit on 04/27/16 (from the past 72 hour(s))  POCT urinalysis dipstick     Status: None   Collection Time: 04/27/16  6:16 PM  Result Value Ref Range   Color, UA yellow    Clarity, UA cloudy    Glucose, UA neg    Bilirubin, UA neg    Ketones, UA neg    Spec Grav, UA 1.020    Blood, UA neg    pH,  UA 7.5    Protein, UA neg    Urobilinogen, UA 1.0    Nitrite, UA neg    Leukocytes, UA Negative Negative  POCT rapid strep A     Status: Abnormal   Collection Time: 04/27/16  6:17 PM  Result Value Ref Range   Rapid Strep A Screen Positive (A) Negative      ASSESSMENT/PLAN: Try single antibiotic which should fairly reliably cover both infections, change based on symptoms and/or urine culture  Sore throat - Plan:  POCT rapid strep A, Cephalexin 500 MG tablet  Dysuria - Plan: POCT Pregnancy, Urine, POCT urinalysis dipstick, Cephalexin 500 MG tablet, Urine culture  Cystitis - Plan: Cephalexin 500 MG tablet   Visit summary printed and instructions reviewed with the patient. ER/RTC precautions were reviewed. All questions answered. Return if symptoms worsen or fail to improve in 2 -3 days.

## 2016-04-27 NOTE — Patient Instructions (Signed)
     IF you received an x-ray today, you will receive an invoice from [facility]. Please contact [facility] at [phone] with questions or concerns regarding your invoice.   IF you received labwork today, you will receive an invoice from [facility]. Please contact [facility] at [phone] with questions or concerns regarding your invoice.   Our billing staff will not be able to assist you with questions regarding bills from these companies.  You will be contacted with the lab results as soon as they are available. The fastest way to get your results is to activate your My Chart account. Instructions are located on the last page of this paperwork. If you have not heard from us regarding the results in 2 weeks, please contact this office.      

## 2016-05-15 ENCOUNTER — Other Ambulatory Visit: Payer: Self-pay | Admitting: Obstetrics and Gynecology

## 2016-05-15 DIAGNOSIS — N926 Irregular menstruation, unspecified: Secondary | ICD-10-CM | POA: Diagnosis not present

## 2016-05-15 DIAGNOSIS — N760 Acute vaginitis: Secondary | ICD-10-CM | POA: Diagnosis not present

## 2016-06-15 DIAGNOSIS — F411 Generalized anxiety disorder: Secondary | ICD-10-CM | POA: Diagnosis not present

## 2016-06-15 DIAGNOSIS — F329 Major depressive disorder, single episode, unspecified: Secondary | ICD-10-CM | POA: Diagnosis not present

## 2016-06-15 DIAGNOSIS — F900 Attention-deficit hyperactivity disorder, predominantly inattentive type: Secondary | ICD-10-CM | POA: Diagnosis not present

## 2016-07-12 DIAGNOSIS — F900 Attention-deficit hyperactivity disorder, predominantly inattentive type: Secondary | ICD-10-CM | POA: Diagnosis not present

## 2016-07-19 MED FILL — VYVANSE 60 MG CAPSULE: 60 | 30 days supply | Qty: 30 | Fill #0

## 2016-08-16 DIAGNOSIS — M25571 Pain in right ankle and joints of right foot: Secondary | ICD-10-CM | POA: Diagnosis not present

## 2016-08-16 DIAGNOSIS — M722 Plantar fascial fibromatosis: Secondary | ICD-10-CM | POA: Diagnosis not present

## 2016-08-16 DIAGNOSIS — M25572 Pain in left ankle and joints of left foot: Secondary | ICD-10-CM | POA: Diagnosis not present

## 2016-11-01 ENCOUNTER — Telehealth: Payer: 59 | Admitting: Family

## 2016-11-01 DIAGNOSIS — J329 Chronic sinusitis, unspecified: Secondary | ICD-10-CM | POA: Diagnosis not present

## 2016-11-01 DIAGNOSIS — B9689 Other specified bacterial agents as the cause of diseases classified elsewhere: Secondary | ICD-10-CM | POA: Diagnosis not present

## 2016-11-01 MED ORDER — AMOXICILLIN-POT CLAVULANATE 875-125 MG PO TABS: 1.0000 | ORAL_TABLET | Freq: Two times a day (BID) | ORAL | 0 refills | Status: AC

## 2016-11-01 NOTE — Progress Notes (Signed)

## 2016-11-21 MED FILL — VYVANSE 60 MG CAPSULE: 60 | 30 days supply | Qty: 30 | Fill #0

## 2017-01-15 DIAGNOSIS — F900 Attention-deficit hyperactivity disorder, predominantly inattentive type: Secondary | ICD-10-CM | POA: Diagnosis not present

## 2017-02-27 ENCOUNTER — Telehealth: Payer: 59 | Admitting: Nurse Practitioner

## 2017-02-27 DIAGNOSIS — J029 Acute pharyngitis, unspecified: Secondary | ICD-10-CM | POA: Diagnosis not present

## 2017-02-27 DIAGNOSIS — R300 Dysuria: Secondary | ICD-10-CM

## 2017-02-27 DIAGNOSIS — N3000 Acute cystitis without hematuria: Secondary | ICD-10-CM | POA: Diagnosis not present

## 2017-02-27 DIAGNOSIS — N3090 Cystitis, unspecified without hematuria: Secondary | ICD-10-CM | POA: Diagnosis not present

## 2017-02-27 MED ORDER — CEPHALEXIN 500 MG PO TABS: 500.0000 mg | ORAL_TABLET | Freq: Three times a day (TID) | ORAL | 0 refills | Status: DC

## 2017-02-27 NOTE — Progress Notes (Signed)

## 2017-03-13 MED FILL — VYVANSE 60 MG CAPSULE: 60 | 30 days supply | Qty: 30 | Fill #0

## 2017-05-04 ENCOUNTER — Telehealth: Payer: 59 | Admitting: Nurse Practitioner

## 2017-05-04 DIAGNOSIS — L03116 Cellulitis of left lower limb: Secondary | ICD-10-CM

## 2017-05-04 MED ORDER — CEPHALEXIN 500 MG PO CAPS: 500.0000 mg | ORAL_CAPSULE | Freq: Three times a day (TID) | ORAL | 0 refills | Status: DC

## 2017-05-04 NOTE — Progress Notes (Signed)
E Visit for Rash  We are sorry that you are not feeling well. Here is how we plan to help!   Based on what you have told me you have cellulitis of a bug bite. Cellulitis is an infection of the skin. The treatment is keflex 500mg  3x a day for 7 days.   HOME CARE:   Take cool showers and avoid direct sunlight.  Apply cool compress or wet dressings.  Take a bath in an oatmeal bath.  Sprinkle content of one Aveeno packet under running faucet with comfortably warm water.  Bathe for 15-20 minutes, 1-2 times daily.  Pat dry with a towel. Do not rub the rash.  Use hydrocortisone cream.  Take an antihistamine like Benadryl for widespread rashes that itch.  The adult dose of Benadryl is 25-50 mg by mouth 4 times daily.  Caution:  This type of medication may cause sleepiness.  Do not drink alcohol, drive, or operate dangerous machinery while taking antihistamines.  Do not take these medications if you have prostate enlargement.  Read package instructions thoroughly on all medications that you take.  GET HELP RIGHT AWAY IF:   Symptoms don't go away after treatment.  Severe itching that persists.  If you rash spreads or swells.  If you rash begins to smell.  If it blisters and opens or develops a yellow-brown crust.  You develop a fever.  You have a sore throat.  You become short of breath.  MAKE SURE YOU:  Understand these instructions. Will watch your condition. Will get help right away if you are not doing well or get worse.  Thank you for choosing an e-visit. Your e-visit answers were reviewed by a board certified advanced clinical practitioner to complete your personal care plan. Depending upon the condition, your plan could have included both over the counter or prescription medications. Please review your pharmacy choice. Be sure that the pharmacy you have chosen is open so that you can pick up your prescription now.  If there is a problem you may message your provider in  MyChart to have the prescription routed to another pharmacy. Your safety is important to us. If you have drug allergies check your prescription carefully.  For the next 24 hours, you can use MyChart to ask questions about today's visit, request a non-urgent call back, or ask for a work or school excuse from your e-visit provider. You will get an email in the next two days asking about your experience. I hope that your e-visit has been valuable and will speed your recovery.

## 2017-05-05 ENCOUNTER — Encounter (HOSPITAL_COMMUNITY): Payer: Self-pay | Admitting: *Deleted

## 2017-05-05 ENCOUNTER — Ambulatory Visit (HOSPITAL_COMMUNITY)
Admission: EM | Admit: 2017-05-05 | Discharge: 2017-05-05 | Disposition: A | Discharge status: Home / Self Care | Payer: 59 | Attending: Physician Assistant | Admitting: Physician Assistant

## 2017-05-05 DIAGNOSIS — L02419 Cutaneous abscess of limb, unspecified: Secondary | ICD-10-CM | POA: Diagnosis not present

## 2017-05-05 DIAGNOSIS — L03119 Cellulitis of unspecified part of limb: Secondary | ICD-10-CM | POA: Diagnosis not present

## 2017-05-05 DIAGNOSIS — L02416 Cutaneous abscess of left lower limb: Secondary | ICD-10-CM | POA: Insufficient documentation

## 2017-05-05 DIAGNOSIS — L03116 Cellulitis of left lower limb: Secondary | ICD-10-CM | POA: Insufficient documentation

## 2017-05-05 DIAGNOSIS — Z8249 Family history of ischemic heart disease and other diseases of the circulatory system: Secondary | ICD-10-CM | POA: Diagnosis not present

## 2017-05-05 DIAGNOSIS — M79662 Pain in left lower leg: Secondary | ICD-10-CM | POA: Diagnosis present

## 2017-05-05 MED ORDER — LIDOCAINE-EPINEPHRINE (PF) 2 %-1:200000 IJ SOLN
INTRAMUSCULAR | Status: AC
  Filled 2017-05-05: qty 20

## 2017-05-05 MED ORDER — DOXYCYCLINE HYCLATE 100 MG PO CAPS: 100.0000 mg | ORAL_CAPSULE | Freq: Two times a day (BID) | ORAL | 0 refills | Status: AC

## 2017-05-05 NOTE — ED Triage Notes (Signed)
States had an e-visit yesterday and a Rx for Keflex was sent in, but pt did not start it.

## 2017-05-05 NOTE — ED Provider Notes (Addendum)
CSN: [id number]     Arrival date & time 05/05/17  1529 History   None    Chief Complaint  Patient presents with  . Insect Bite   (Consider location/radiation/quality/duration/timing/severity/associated sxs/prior Treatment) HPI   Lower left leg pain that started yesterday.  Works in the hospital.  Denies itching.  Has redness and TTP about the skin.  Thinks it may be a bug bite.  Feels she is getting worse.  Has tried OTC meds with pain with good relief.  Has a history of staph infection.     History reviewed. No pertinent past medical history. Past Surgical History:  Procedure Laterality Date  . NO PAST SURGERIES     Family History  Problem Relation Age of Onset  . Hypertension Maternal Grandfather   . Hypertension Mother   . Hypertension Maternal Grandmother    Social History  Substance Use Topics  . Smoking status: Never Smoker  . Smokeless tobacco: Never Used  . Alcohol use Yes     Comment: occasionally   OB History    Gravida Para Term Preterm AB Living   1 1 1     1    SAB TAB Ectopic Multiple Live Births         0 1     Review of Systems  Constitutional: Negative for activity change and appetite change.  Gastrointestinal: Negative for nausea.  Skin: Positive for rash and wound.    Allergies  Patient has no known allergies.  Home Medications   Prior to Admission medications   Medication Sig Start Date End Date Taking? Authorizing Provider  cephALEXin (KEFLEX) 500 MG capsule Take 1 capsule (500 mg total) by mouth 3 (three) times daily. 05/04/17   Daphine Deutscher Mary-Margaret, FNP  Cephalexin 500 MG tablet Take 1 tablet (500 mg total) by mouth 3 (three) times daily. For 5 days 02/27/17   Bennie Pierini, FNP  dexmethylphenidate (FOCALIN XR) 20 MG 24 hr capsule Take 20 mg by mouth daily.    [provider]  escitalopram (LEXAPRO) 10 MG tablet Take 10 mg by mouth daily.    [provider]  oxyCODONE-acetaminophen (ROXICET) 5-325 MG per tablet Take  2 tablets by mouth every 4 (four) hours as needed. May take 1-2 tablets every 4-6 hours as needed for pain Patient not taking: Reported on 04/27/2016 07/18/15   Waynard Reeds, MD   Meds Ordered and Administered this Visit  Medications - No data to display  BP 106/71   Pulse 77   Temp 98.3 F (36.8 C) (Oral)   Resp 16   SpO2 98%  No data found.   Physical Exam  Constitutional: She appears well-developed and well-nourished. No distress.  Cardiovascular: Normal rate.   Pulmonary/Chest: Effort normal.  Skin: She is not diaphoretic.       Risk and benefits discussed and verbal consent obtained. Anesthetic allergies reviewed. Patient anesthetized using 1:1 mix of 2% lidocaine with epi. A 1 cm incision was made using a number 11 blade and purulent material was expressed.  The was not wound packed. The patient tolerated the procedure without difficulty.   A clean dressing was placed and wound care instructions were provided.    Urgent Care Course     Procedures (including critical care time)  Labs Review Labs Reviewed - No data to display  Imaging Review No results found.      MDM   1. Cellulitis and abscess of leg    Drained here.  She will start doxycycline  and warm compress and RTC or follow with her pcp.    Ofilia Neaslark, Michael L, PA-C 05/05/17 1650    Ofilia Neaslark, Michael L, PA-C 05/05/17 [phone]

## 2017-05-05 NOTE — ED Triage Notes (Signed)
Noticed centralized lesion with surrounding redness to anterior left lower leg yesterday morning.  C/O throbbing pain.  Denies itching.  Denies fevers.  No known insect bites.

## 2017-05-05 NOTE — Discharge Instructions (Addendum)
Do not miss doses of doxycycline. Use a warm compress very often.

## 2017-05-08 LAB — AEROBIC CULTURE W GRAM STAIN (SUPERFICIAL SPECIMEN)

## 2017-05-08 LAB — AEROBIC CULTURE  (SUPERFICIAL SPECIMEN)

## 2017-06-21 ENCOUNTER — Encounter: Payer: Self-pay | Admitting: Physician Assistant

## 2017-06-21 ENCOUNTER — Ambulatory Visit (INDEPENDENT_AMBULATORY_CARE_PROVIDER_SITE_OTHER): Payer: 59 | Admitting: Physician Assistant

## 2017-06-21 VITALS — BP 108/72 | HR 91 | Temp 98.6°F | Resp 18 | Ht 67.5 in | Wt 196.0 lb

## 2017-06-21 DIAGNOSIS — J029 Acute pharyngitis, unspecified: Secondary | ICD-10-CM | POA: Diagnosis not present

## 2017-06-21 LAB — POCT RAPID STREP A (OFFICE): Rapid Strep A Screen: NEGATIVE

## 2017-06-21 NOTE — Patient Instructions (Addendum)
Motrin for the pain and fever    IF you received an x-ray today, you will receive an invoice from [facility]. Please contact [facility] at [phone] with questions or concerns regarding your invoice.   IF you received labwork today, you will receive an invoice from [facility]. Please contact [facility] at [phone] with questions or concerns regarding your invoice.   Our billing staff will not be able to assist you with questions regarding bills from these companies.  You will be contacted with the lab results as soon as they are available. The fastest way to get your results is to activate your My Chart account. Instructions are located on the last page of this paperwork. If you have not heard from us regarding the results in 2 weeks, please contact this office.

## 2017-06-21 NOTE — Progress Notes (Signed)
Jane Burnett  MRN: [id number] DOB: Feb 26, 1996  PCP: Mila Palmer, MD  Chief Complaint  Patient presents with  . Sore Throat    Subjective:  Pt presents to clinic for sore throat and fevers that started yesterday am that has continued to get worse.  She feels terrible.  No sick contacts that she knows of.  She has been using nyquil and tylenol.  History is obtained by patient.  Review of Systems  Constitutional: Positive for chills and fever.  HENT: Positive for sore throat. Negative for congestion and postnasal drip.   Respiratory: Negative for cough.   Gastrointestinal: Positive for nausea.  Musculoskeletal: Positive for myalgias.  Neurological: Positive for headaches.    There are no active problems to display for this patient.   No current outpatient prescriptions on file prior to visit.   No current facility-administered medications on file prior to visit.     No Known Allergies  No past medical history on file. Social History   Social History Narrative  . No narrative on file   Social History  Substance Use Topics  . Smoking status: Never Smoker  . Smokeless tobacco: Never Used  . Alcohol use Yes     Comment: occasionally   family history includes Hypertension in her maternal grandfather, maternal grandmother, and mother.     Objective:  BP 108/72   Pulse 91   Temp 98.6 F (37 C) (Oral)   Resp 18   Ht 5' 7.5" (1.715 m)   Wt 196 lb (88.9 kg)   SpO2 98%   BMI 30.24 kg/m  Body mass index is 30.24 kg/m.  Physical Exam  Constitutional: She is oriented to person, place, and time and well-developed, well-nourished, and in no distress.  HENT:  Head: Normocephalic and atraumatic.  Right Ear: Hearing, tympanic membrane, external ear and ear canal normal.  Left Ear: Hearing, tympanic membrane, external ear and ear canal normal.  Nose: Nose normal.  Mouth/Throat: Uvula is midline and mucous membranes are normal. Posterior oropharyngeal edema  and posterior oropharyngeal erythema present. No oropharyngeal exudate.  Eyes: Conjunctivae are normal.  Neck: Normal range of motion.  Cardiovascular: Normal rate, regular rhythm and normal heart sounds.   No murmur heard. Pulmonary/Chest: Effort normal and breath sounds normal.  Lymphadenopathy:       Head (right side): No occipital adenopathy present.       Head (left side): No occipital adenopathy present.    She has cervical adenopathy.       Right cervical: No superficial cervical adenopathy present.      Left cervical: Superficial cervical adenopathy present.       Right: No supraclavicular adenopathy present.       Left: No supraclavicular adenopathy present.  Neurological: She is alert and oriented to person, place, and time. Gait normal.  Skin: Skin is warm and dry.  Psychiatric: Mood, memory, affect and judgment normal.  Vitals reviewed.   Results for orders placed or performed in visit on 06/21/17  POCT rapid strep A  Result Value Ref Range   Rapid Strep A Screen Negative Negative    Assessment and Plan :  Sore throat - Plan: POCT rapid strep A, Culture, Group A Strep  Motrin and tylenol for the pain and the fever - out of work today due to fever this am when she woke up.  We will treat if throat culture returns positive but at this time uvulitis is the most likely cause of the current symptoms.  Benny LennertSarah Weber PA-C  [facility] at [facility] 06/21/2017 10:25 AM

## 2017-06-24 LAB — CULTURE, GROUP A STREP: Strep A Culture: NEGATIVE

## 2017-10-04 ENCOUNTER — Telehealth: Payer: 59 | Admitting: Family

## 2017-10-04 DIAGNOSIS — J329 Chronic sinusitis, unspecified: Secondary | ICD-10-CM | POA: Diagnosis not present

## 2017-10-04 DIAGNOSIS — B9789 Other viral agents as the cause of diseases classified elsewhere: Secondary | ICD-10-CM

## 2017-10-04 MED ORDER — FLUTICASONE PROPIONATE 50 MCG/ACT NA SUSP: 1.0000 | Freq: Two times a day (BID) | NASAL | 6 refills | Status: AC

## 2017-10-04 MED ORDER — PREDNISONE 5 MG PO TABS: 5.0000 mg | ORAL_TABLET | ORAL | 0 refills | Status: AC

## 2017-10-04 NOTE — Progress Notes (Signed)
Thank you for the details you included in the comment boxes. Those details are very helpful in determining the best course of treatment for you and help us to provide the best care. Eye swelling can sometimes happen with a sinus infection. If this changes to green/crusty drainage, please let us know.  We are sorry that you are not feeling well.  Here is how we plan to help!  Based on what you have shared with me it looks like you have sinusitis.  Sinusitis is inflammation and infection in the sinus cavities of the head.  Based on your presentation I believe you most likely have Acute Viral Sinusitis.This is an infection most likely caused by a virus. There is not specific treatment for viral sinusitis other than to help you with the symptoms until the infection runs its course.  You may use an oral decongestant such as Mucinex D or if you have glaucoma or high blood pressure use plain Mucinex. Saline nasal spray help and can safely be used as often as needed for congestion, I have prescribed: Fluticasone nasal spray two sprays in each nostril once a day   And a prednisone dose pack for inflammation in your eye and ears.   Some authorities believe that zinc sprays or the use of Echinacea may shorten the course of your symptoms.  Sinus infections are not as easily transmitted as other respiratory infection, however we still recommend that you avoid close contact with loved ones, especially the very young and elderly.  Remember to wash your hands thoroughly throughout the day as this is the number one way to prevent the spread of infection!  Home Care:  Only take medications as instructed by your medical team.  Complete the entire course of an antibiotic.  Do not take these medications with alcohol.  A steam or ultrasonic humidifier can help congestion.  You can place a towel over your head and breathe in the steam from hot water coming from a faucet.  Avoid close contacts especially the very  young and the elderly.  Cover your mouth when you cough or sneeze.  Always remember to wash your hands.  Get Help Right Away If:  You develop worsening fever or sinus pain.  You develop a severe head ache or visual changes.  Your symptoms persist after you have completed your treatment plan.  Make sure you  Understand these instructions.  Will watch your condition.  Will get help right away if you are not doing well or get worse.  Your e-visit answers were reviewed by a board certified advanced clinical practitioner to complete your personal care plan.  Depending on the condition, your plan could have included both over the counter or prescription medications.  If there is a problem please reply  once you have received a response from your provider.  Your safety is important to us.  If you have drug allergies check your prescription carefully.    You can use MyChart to ask questions about today's visit, request a non-urgent call back, or ask for a work or school excuse for 24 hours related to this e-Visit. If it has been greater than 24 hours you will need to follow up with your provider, or enter a new e-Visit to address those concerns.  You will get an e-mail in the next two days asking about your experience.  I hope that your e-visit has been valuable and will speed your recovery. Thank you for using e-visits.

## 2019-05-08 ENCOUNTER — Other Ambulatory Visit: Payer: Self-pay

## 2019-05-08 ENCOUNTER — Other Ambulatory Visit: Payer: 59

## 2019-05-08 DIAGNOSIS — Z20822 Contact with and (suspected) exposure to covid-19: Secondary | ICD-10-CM

## 2019-05-12 LAB — NOVEL CORONAVIRUS, NAA: SARS-CoV-2, NAA: NOT DETECTED

## 2019-05-21 ENCOUNTER — Ambulatory Visit (INDEPENDENT_AMBULATORY_CARE_PROVIDER_SITE_OTHER): Payer: Managed Care, Other (non HMO)

## 2019-05-21 ENCOUNTER — Ambulatory Visit
Admission: EM | Admit: 2019-05-21 | Discharge: 2019-05-21 | Disposition: A | Discharge status: Home / Self Care | Payer: Managed Care, Other (non HMO) | Attending: Family Medicine | Admitting: Family Medicine

## 2019-05-21 ENCOUNTER — Other Ambulatory Visit: Payer: Self-pay

## 2019-05-21 DIAGNOSIS — R0789 Other chest pain: Secondary | ICD-10-CM

## 2019-05-21 MED ORDER — ALBUTEROL SULFATE HFA 108 (90 BASE) MCG/ACT IN AERS: 1.0000 | INHALATION_SPRAY | Freq: Four times a day (QID) | RESPIRATORY_TRACT | 0 refills | Status: AC | PRN

## 2019-05-21 MED ORDER — SPACER/AERO-HOLDING CHAMBERS DEVI: 1.0000 "application " | Freq: Once | 0 refills | Status: AC

## 2019-05-21 NOTE — Discharge Instructions (Addendum)
Follow up  with primary if symptoms persisting as well as further evaluation of variable heart rate  Follow up in emergency room if symptoms worsening  May try albuterol as needed for shortness of breath, chest tightness  Rest, drink fluids

## 2019-05-21 NOTE — ED Triage Notes (Addendum)
Per pt she has been having fevers for about 3 days off and on mostly in the evenings. No chills. No cough, no congestion. Has been tested 2 times for covid and both negative. Did rapid today . Pt wants chest x-ray [facility] bc of rapid heart rate and covid symptoms. Pt said chest is uncomfortable when she takes a deep breathe.

## 2019-05-21 NOTE — ED Provider Notes (Signed)
[facility]    CSN: [id number] Arrival date & time: 05/21/19  1619      History   Chief Complaint Chief Complaint  Patient presents with  . Fever    HPI Jane Burnett is a 23 y.o. female no significant past medical history presenting today for evaluation of chest discomfort and shortness of breath.  Patient states that over the past week she has had a slight discomfort with taking deep breath that is central.  She notes that she has had a very infrequent cough, denies associated congestion or sore throat.  She has noted intermittent fevers at nighttime and has felt slightly fatigued.  She has been tested for COVID twice, most recently today which was negative.  Denies consistent tobacco use, does occasionally vape.  Denies history of heart issues, high blood pressure or diabetes.  Does have family history of MI, but denies at an early age.  Denies leg pain or leg swelling.  Denies previous DVT/PE.  Denies exogenous use of estrogen, has IUD placed.  [place] the beach, but length of immobilization was approximately 4 hours.  HPI  No past medical history on file.  There are no active problems to display for this patient.   Past Surgical History:  Procedure Laterality Date  . NO PAST SURGERIES      OB History    Gravida  1   Para  1   Term  1   Preterm      AB      Living  1     SAB      TAB      Ectopic      Multiple  0   Live Births  1            Home Medications    Prior to Admission medications   Medication Sig Start Date End Date Taking? Authorizing Provider  albuterol (VENTOLIN HFA) 108 (90 Base) MCG/ACT inhaler Inhale 1-2 puffs into the lungs every 6 (six) hours as needed for wheezing or shortness of breath. 05/21/19   Wieters, Hallie C, PA-C  fluticasone (FLONASE) 50 MCG/ACT nasal spray Place 1 spray into both nostrils 2 (two) times daily. For 7 days then once daily 10/04/17   Withrow, Everardo AllJohn C, FNP  levonorgestrel  (MIRENA, 52 MG,) 20 MCG/24HR IUD Mirena 20 mcg/24 hr (5 years) intrauterine device    [provider]  lisdexamfetamine (VYVANSE) 60 MG capsule Take 60 mg by mouth every morning.    [provider]  predniSONE (DELTASONE) 5 MG tablet Take 1 tablet (5 mg total) by mouth as directed. sterapred generic taper 10/04/17   Beau FannyWithrow, John C, FNP  Spacer/Aero-Holding [place]re[place] DEVI 1 application by Does not apply route once for 1 dose. 05/21/19 05/21/19  Wieters, Junius CreamerHallie C, PA-C    Family History Family History  Problem Relation Age of Onset  . Hypertension Maternal Grandfather   . Hypertension Mother   . Hypertension Maternal Grandmother     Social History Social History   Tobacco Use  . Smoking status: Never Smoker  . Smokeless tobacco: Never Used  Substance Use Topics  . Alcohol use: Yes    Comment: occasionally  . Drug use: No     Allergies   Patient has no known allergies.   Review of Systems Review of Systems  Constitutional: Positive for fever. Negative for activity change, appetite change, chills and fatigue.  HENT: Negative for congestion, ear pain, rhinorrhea, sinus pressure,  sore throat and trouble swallowing.   Eyes: Negative for discharge and redness.  Respiratory: Positive for cough, chest tightness and shortness of breath.   Cardiovascular: Negative for chest pain.  Gastrointestinal: Negative for abdominal pain, diarrhea, nausea and vomiting.  Musculoskeletal: Negative for myalgias.  Skin: Negative for rash.  Neurological: Negative for dizziness, light-headedness and headaches.     Physical Exam Triage Vital Signs ED Triage Vitals  Enc Vitals Group     BP 05/21/19 1631 116/81     Pulse Rate 05/21/19 1631 (!) 122     Resp 05/21/19 1631 16     Temp 05/21/19 1631 98.3 F (36.8 C)     Temp Source 05/21/19 1631 Oral     SpO2 05/21/19 1631 94 %     Weight --      Height --      Head Circumference --      Peak Flow --      Pain Score 05/21/19  1633 5     Pain Loc --      Pain Edu? --      Excl. in GC? --    No data found.  Updated Vital Signs BP 116/81 (BP Location: Right Arm)   Pulse (!) 122   Temp 98.3 F (36.8 C) (Oral)   Resp 16   SpO2 94%   Visual Acuity Right Eye Distance:   Left Eye Distance:   Bilateral Distance:    Right Eye Near:   Left Eye Near:    Bilateral Near:     Physical Exam Vitals signs and nursing note reviewed.  Constitutional:      General: She is not in acute distress.    Appearance: She is well-developed.  HENT:     Head: Normocephalic and atraumatic.     Ears:     Comments: Bilateral ears without tenderness to palpation of external auricle, tragus and mastoid, EAC's without erythema or swelling, TM's with good bony landmarks and cone of light. Non erythematous.    Mouth/Throat:     Comments: Oral mucosa pink and moist, no tonsillar enlargement or exudate. Posterior pharynx patent and nonerythematous, no uvula deviation or swelling. Normal phonation.  Eyes:     Conjunctiva/sclera: Conjunctivae normal.  Neck:     Musculoskeletal: Neck supple.  Cardiovascular:     Rate and Rhythm: Regular rhythm.     Heart sounds: No murmur.     Comments: Heart rate varying from 90-120 Strong pulse present bilaterally Pulmonary:     Effort: Pulmonary effort is normal. No respiratory distress.     Breath sounds: Normal breath sounds.     Comments: Breathing comfortably at rest, CTABL, no wheezing, rales or other adventitious sounds auscultated Abdominal:     Palpations: Abdomen is soft.     Tenderness: There is no abdominal tenderness.  Musculoskeletal:     Comments: Anterior chest nontender to palpation  Bilateral lower extremities symmetric, no calf tenderness or erythema  Skin:    General: Skin is warm and dry.  Neurological:     Mental Status: She is alert.      UC Treatments / Results  Labs (all labs ordered are listed, but only abnormal results are displayed) Labs Reviewed - No  data to display  EKG   Radiology Dg Chest 2 View  Result Date: 05/21/2019 CLINICAL DATA:  Chest discomfort, shortness of breath, fatigue EXAM: CHEST - 2 VIEW COMPARISON:  None FINDINGS: Normal heart size, mediastinal contours, and pulmonary vascularity. Lungs clear.  No pleural effusion or pneumothorax. Bones unremarkable. IMPRESSION: Normal exam. Electronically Signed   By: Lavonia Dana M.D.   On: 05/21/2019 17:29    Procedures Procedures (including critical care time)  Medications Ordered in UC Medications - No data to display  Initial Impression / Assessment and Plan / UC Course  I have reviewed the triage vital signs and the nursing notes.  Pertinent labs & imaging results that were available during my care of the patient were reviewed by me and considered in my medical decision making (see chart for details).     EKG normal sinus rhythm with sinus arrhythmia.  Heart rate ranging from 90-120; no A. Fib.  Noted.  Chest x-ray normal.  Recent negative COVID testing.  Unclear cause of patient's chest discomfort.  PERC negative.  Did advise for her to continue to monitor symptoms and follow-up in emergency room if chest discomfort and shortness of breath worsening.  Treating symptomatically and supportively for viral etiology given intermittent fevers.  Albuterol as needed.  Recommended following up with PCP/cardiology for further evaluation of heart rate variation.Discussed strict return precautions. Patient verbalized understanding and is agreeable with plan.  Final Clinical Impressions(s) / UC Diagnoses   Final diagnoses:  Atypical chest pain     Discharge Instructions     Follow up  with primary if symptoms persisting as well as further evaluation of variable heart rate  Follow up in emergency room if symptoms worsening  May try albuterol as needed for shortness of breath, chest tightness  Rest, drink fluids   ED Prescriptions    Medication Sig Dispense Auth. Provider    albuterol (VENTOLIN HFA) 108 (90 Base) MCG/ACT inhaler Inhale 1-2 puffs into the lungs every 6 (six) hours as needed for wheezing or shortness of breath. 6.7 g Wieters, Logan C, PA-C   Spacer/Aero-Holding [place] DEVI 1 application by Does not apply route once for 1 dose. 1 each Wieters, Elesa Hacker, PA-C     Controlled Substance Prescriptions [place] Controlled Substance Registry consulted? Not Applicable   Janith Lima, [place] 05/21/19 [phone]

## 2019-08-24 ENCOUNTER — Other Ambulatory Visit: Payer: Self-pay

## 2019-08-24 DIAGNOSIS — Z20822 Contact with and (suspected) exposure to covid-19: Secondary | ICD-10-CM

## 2019-08-25 LAB — NOVEL CORONAVIRUS, NAA: SARS-CoV-2, NAA: NOT DETECTED

## 2019-11-06 ENCOUNTER — Other Ambulatory Visit: Payer: Managed Care, Other (non HMO)

## 2020-02-18 ENCOUNTER — Ambulatory Visit (INDEPENDENT_AMBULATORY_CARE_PROVIDER_SITE_OTHER): Payer: No Typology Code available for payment source | Admitting: Otolaryngology

## 2020-02-18 ENCOUNTER — Encounter (INDEPENDENT_AMBULATORY_CARE_PROVIDER_SITE_OTHER): Payer: Self-pay | Admitting: Otolaryngology

## 2020-02-18 ENCOUNTER — Other Ambulatory Visit: Payer: Self-pay

## 2020-02-18 VITALS — Temp 98.2°F

## 2020-02-18 DIAGNOSIS — J310 Chronic rhinitis: Secondary | ICD-10-CM

## 2020-02-18 DIAGNOSIS — Z87898 Personal history of other specified conditions: Secondary | ICD-10-CM | POA: Diagnosis not present

## 2020-02-18 DIAGNOSIS — J342 Deviated nasal septum: Secondary | ICD-10-CM | POA: Diagnosis not present

## 2020-02-18 NOTE — Progress Notes (Signed)
HPI: Jane Burnett is a 24 y.o. female who presents for evaluation of chronic nasal symptoms.  She has nasal congestion which is worse when she lies down at night.  She stays pretty clear during the day but gets stuffiness from time to time.  She has had occasional nosebleeds maybe once or twice a month.  But no recent nosebleeds.  She has used Flonase in the past but has not been using it regularly.  She does have history of allergies.  She gets headaches occasionally. She seems to have more nosebleeds during the winter months.  No past medical history on file. Past Surgical History:  Procedure Laterality Date  . NO PAST SURGERIES     Social History   Socioeconomic History  . Marital status: Single    Spouse name: Not on file  . Number of children: Not on file  . Years of education: Not on file  . Highest education level: Not on file  Occupational History  . Not on file  Tobacco Use  . Smoking status: Never Smoker  . Smokeless tobacco: Never Used  Substance and Sexual Activity  . Alcohol use: Yes    Comment: occasionally  . Drug use: No  . Sexual activity: Never  Other Topics Concern  . Not on file  Social History Narrative  . Not on file   Social Determinants of Health   Financial Resource Strain:   . Difficulty of Paying Living Expenses:   Food Insecurity:   . Worried About Charity fundraiser in the Last Year:   . Arboriculturist in the Last Year:   Transportation Needs:   . Film/video editor (Medical):   Marland Kitchen Lack of Transportation (Non-Medical):   Physical Activity:   . Days of Exercise per Week:   . Minutes of Exercise per Session:   Stress:   . Feeling of Stress :   Social Connections:   . Frequency of Communication with Friends and Family:   . Frequency of Social Gatherings with Friends and Family:   . Attends Religious Services:   . Active Member of Clubs or Organizations:   . Attends Archivist Meetings:   Marland Kitchen Marital Status:    Family  History  Problem Relation Age of Onset  . Hypertension Maternal Grandfather   . Hypertension Mother   . Hypertension Maternal Grandmother    No Known Allergies Prior to Admission medications   Medication Sig Start Date End Date Taking? Authorizing Provider  albuterol (VENTOLIN HFA) 108 (90 Base) MCG/ACT inhaler Inhale 1-2 puffs into the lungs every 6 (six) hours as needed for wheezing or shortness of breath. 05/21/19  Yes Wieters, Hallie C, PA-C  fluticasone (FLONASE) 50 MCG/ACT nasal spray Place 1 spray into both nostrils 2 (two) times daily. For 7 days then once daily 10/04/17  Yes Withrow, Elyse Jarvis, FNP  levonorgestrel (MIRENA, 52 MG,) 20 MCG/24HR IUD Mirena 20 mcg/24 hr (5 years) intrauterine device   Yes [provider]  lisdexamfetamine (VYVANSE) 60 MG capsule Take 60 mg by mouth every morning.   Yes [provider]  predniSONE (DELTASONE) 5 MG tablet Take 1 tablet (5 mg total) by mouth as directed. sterapred generic taper Patient not taking: Reported on 02/18/2020 10/04/17   Benjamine Mola, FNP     Positive ROS: Otherwise negative  All other systems have been reviewed and were otherwise negative with the exception of those mentioned in the HPI and as above.  Physical Exam:  Constitutional: Alert, well-appearing, no acute distress Ears: External ears without lesions or tenderness. Ear canals are clear bilaterally with intact, clear TMs bilaterally. Nasal: External nose without lesions. Septum is deviated to the right with mild rhinitis..  Both middle meatus regions are clear with no signs of infection.  Nasal cavity is otherwise clear.  She appears to move probably 70% of her air through her left nostril and 30% the right nostril secondary to deviated septum.  No polyps and no signs of infection. Oral: Lips and gums without lesions. Tongue and palate mucosa without lesions. Posterior oropharynx clear.  Average size bilaterally with clear oropharynx and no palatal  abnormality noted. Neck: No palpable adenopathy or masses Respiratory: Breathing comfortably  Skin: No facial/neck lesions or rash noted.  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 For nasal congestion recommended regular use of either Nasacort or Flonase and gave her a prescription for either 1.  2 sprays each nostril at night. Recommended use of saline irrigation for postnasal drainage and use of nasal gel if she has dry nose or bleeding in the nose. She can follow-up here if she has recurrent nosebleeds for cauterization if needed.  But I cannot identify definite site of origin of her epistaxis in the office today. If she has chronic nasal obstruction despite regular use of nasal steroid spray could consider surgical options which would include septoplasty and turbinate reductions.. She will follow-up as needed  Narda Bonds, MD

## 2020-05-20 IMAGING — DX CHEST - 2 VIEW
2 series · 2 of 2 positions shown · non-contrast
Comparison: None

CLINICAL DATA: Chest discomfort, shortness of breath, fatigue

EXAM:
CHEST - 2 VIEW

[chest pa]
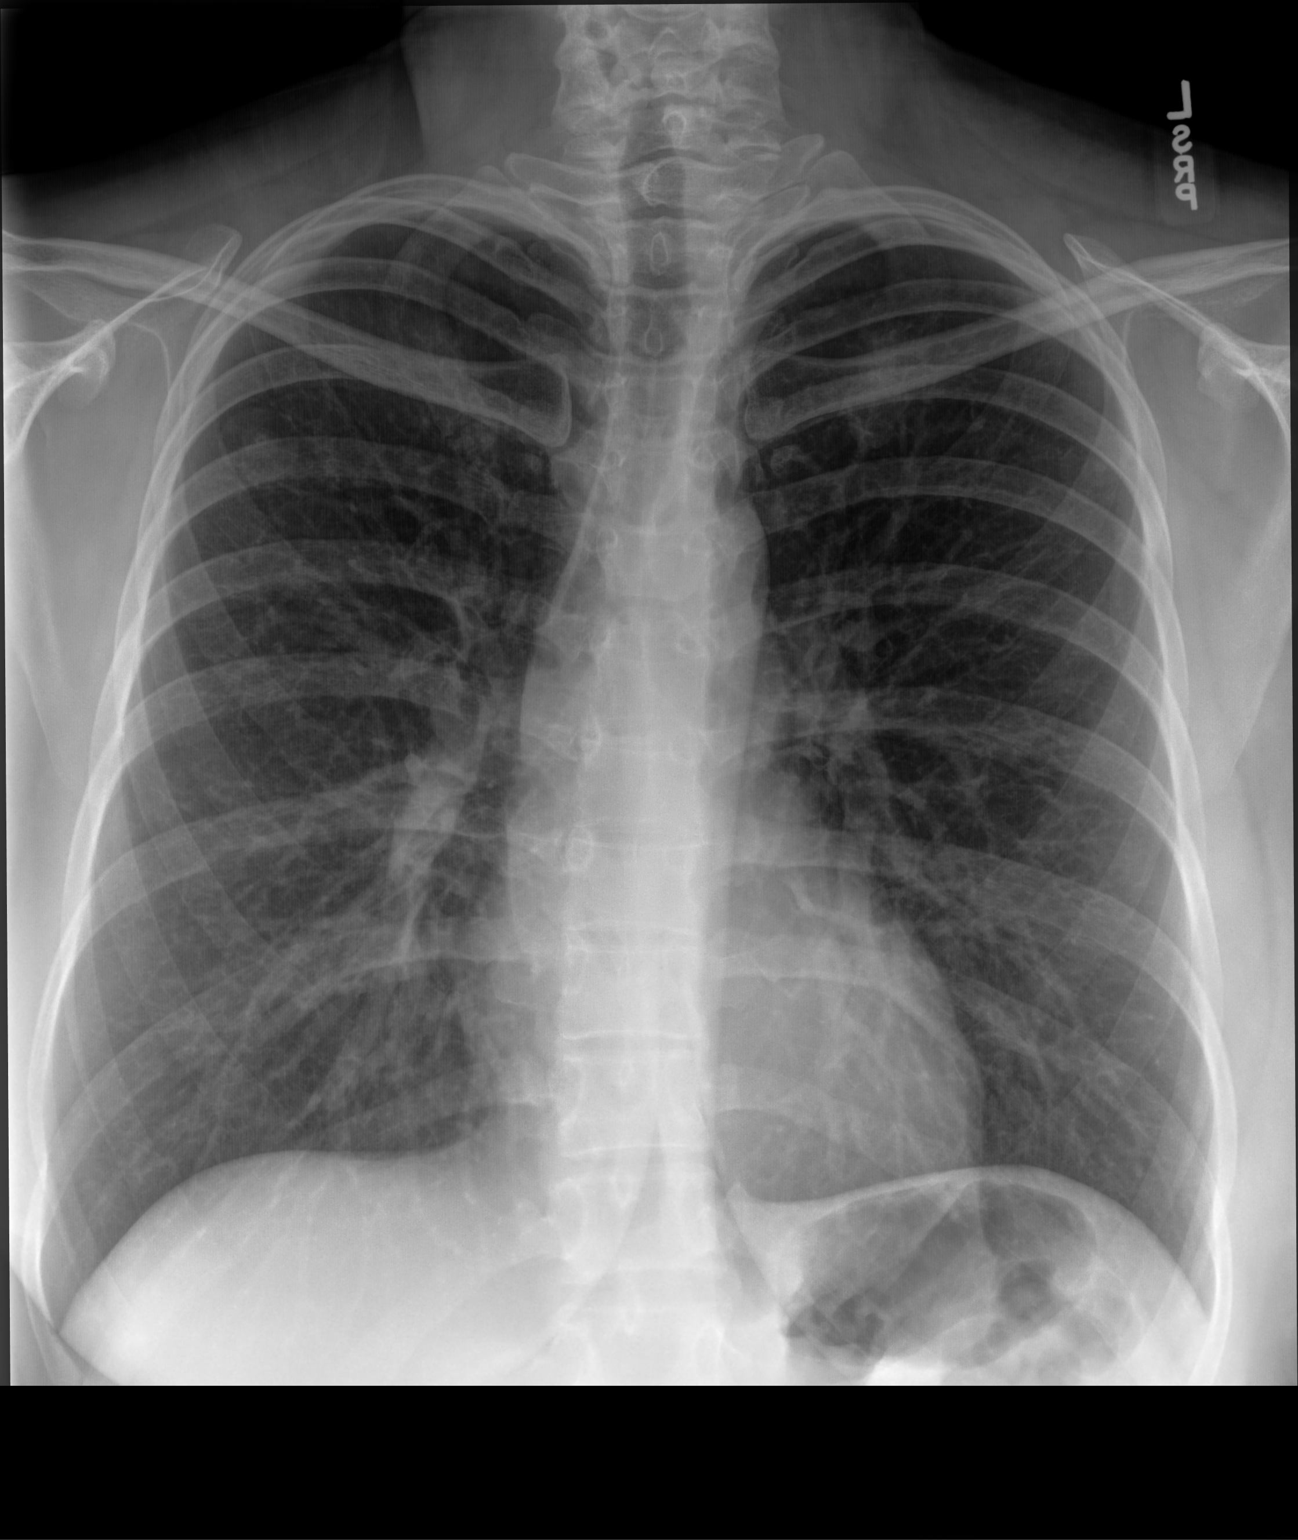

[chest lat]
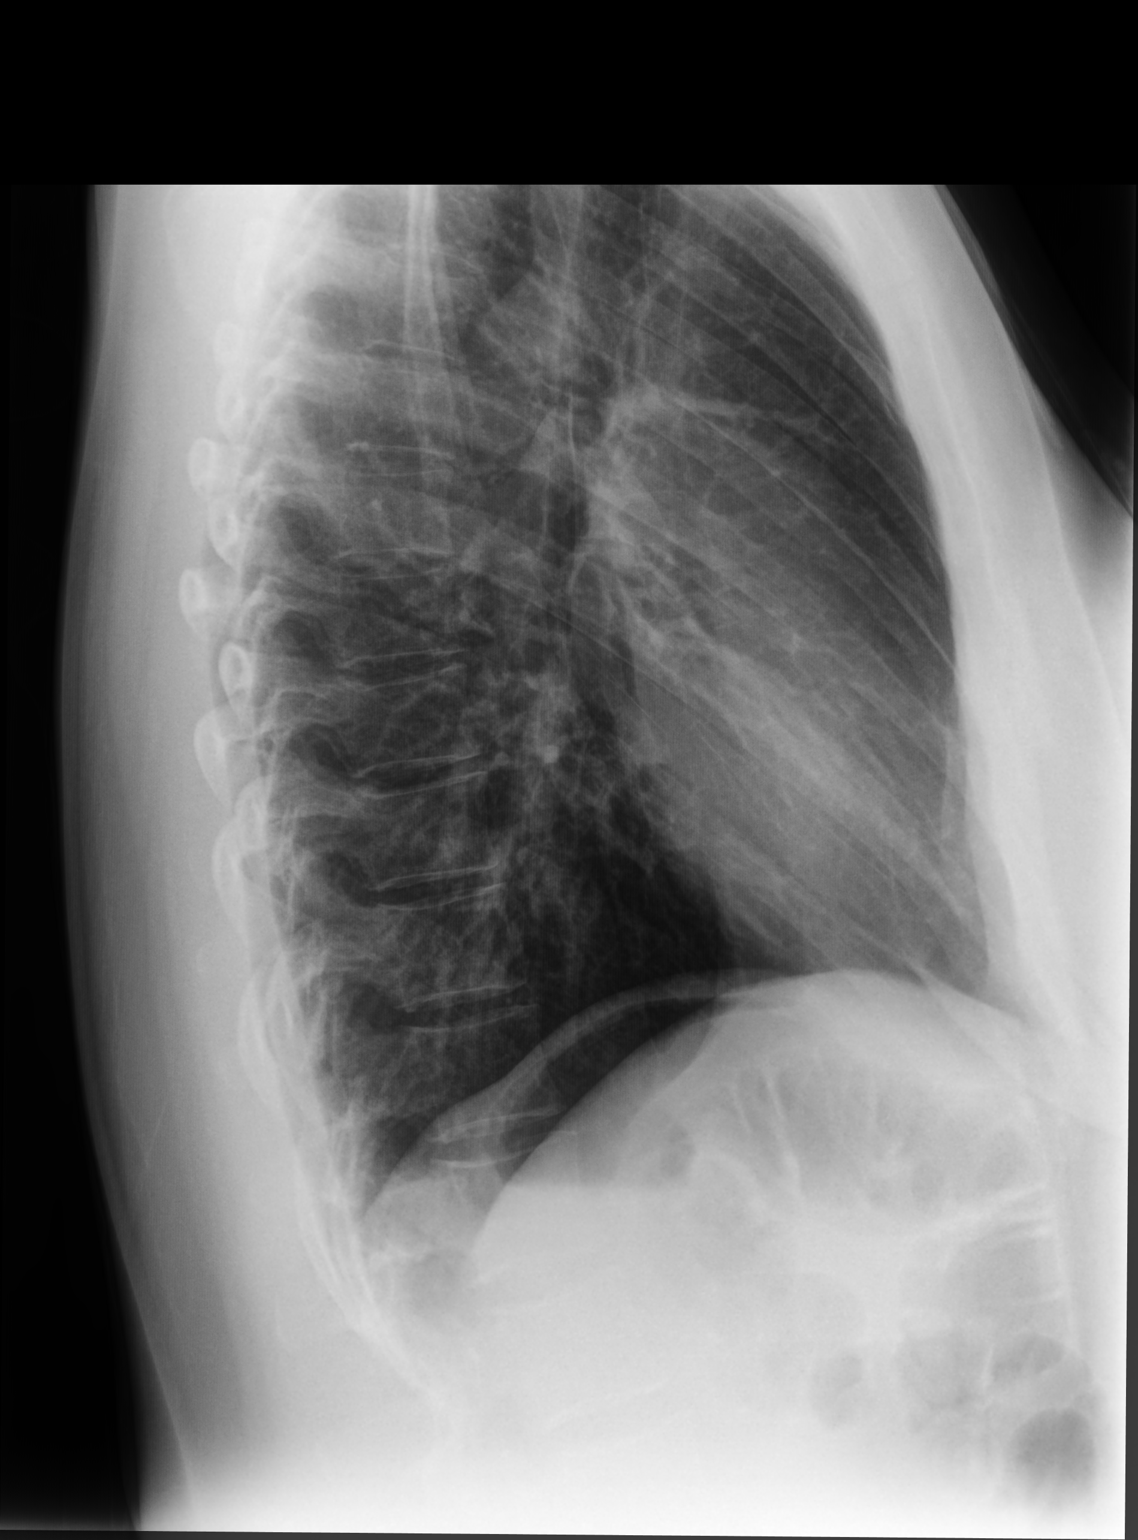

[2 of 2 positions shown; findings below may reference images not displayed]

FINDINGS: Normal heart size, mediastinal contours, and pulmonary vascularity.

Lungs clear.

No pleural effusion or pneumothorax.

Bones unremarkable.
IMPRESSION: Normal exam.

## 2020-11-03 ENCOUNTER — Other Ambulatory Visit: Payer: No Typology Code available for payment source

## 2023-12-24 ENCOUNTER — Ambulatory Visit (HOSPITAL_BASED_OUTPATIENT_CLINIC_OR_DEPARTMENT_OTHER)

## 2023-12-24 ENCOUNTER — Ambulatory Visit (HOSPITAL_BASED_OUTPATIENT_CLINIC_OR_DEPARTMENT_OTHER): Admitting: Student

## 2023-12-24 ENCOUNTER — Encounter (HOSPITAL_BASED_OUTPATIENT_CLINIC_OR_DEPARTMENT_OTHER): Payer: Self-pay | Admitting: Student

## 2023-12-24 DIAGNOSIS — M25562 Pain in left knee: Secondary | ICD-10-CM

## 2023-12-24 NOTE — Progress Notes (Signed)
 Chief Complaint: Left knee injury     History of Present Illness:   Discussed the use of AI scribe software for clinical note transcription with the patient, who gave verbal consent to proceed.  Jane Burnett is a 28 year old female who presents with left knee pain following a skiing accident. The skiing accident occurred approximately a week and a half ago. During the incident, her skis did not release, and she fell, resulting in immediate pain and a sensation of her knee feeling 'loose'. Initially, there was significant swelling, which has since decreased. However, she continues to experience tightness and soreness in the knee. She is unable to fully bend or extend the knee and describes the knee as feeling 'tight and sore.' Rates pain as mild.  While standing on both legs is stable, she does not feel confident standing on her left leg alone for an extended period. No history of previous knee injuries and no pain radiating into the calf. No instability, buckling, or giving out. No significant popping, clicking, or catching sensations since the incident. She is able to walk but limps due to stiffness rather than pain. She has been managing her symptoms with ibuprofen, ice, and compression. She spends most of her day seated at a desk, which she finds uncomfortable.     Surgical History:   None  PMH/PSH/Family History/Social History/Meds/Allergies:   History reviewed. No pertinent past medical history. Past Surgical History:  Procedure Laterality Date   NO PAST SURGERIES     Social History   Socioeconomic History   Marital status: Single    Spouse name: Not on file   Number of children: Not on file   Years of education: Not on file   Highest education level: Not on file  Occupational History   Not on file  Tobacco Use   Smoking status: Never   Smokeless tobacco: Never  Substance and Sexual Activity   Alcohol use: Yes    Comment: occasionally    Drug use: No   Sexual activity: Never  Other Topics Concern   Not on file  Social History Narrative   Not on file   Social Drivers of Health   Financial Resource Strain: Not on file  Food Insecurity: Not on file  Transportation Needs: Not on file  Physical Activity: Not on file  Stress: Not on file  Social Connections: Unknown (03/06/2022)   Received from [facility]   Social Network    Social Network: Not on file   Family History  Problem Relation Age of Onset   Hypertension Maternal Grandfather    Hypertension Mother    Hypertension Maternal Grandmother    No Known Allergies Current Outpatient Medications  Medication Sig Dispense Refill   albuterol (VENTOLIN HFA) 108 (90 Base) MCG/ACT inhaler Inhale 1-2 puffs into the lungs every 6 (six) hours as needed for wheezing or shortness of breath. 6.7 g 0   fluticasone (FLONASE) 50 MCG/ACT nasal spray Place 1 spray into both nostrils 2 (two) times daily. For 7 days then once daily 16 g 6   levonorgestrel (MIRENA, 52 MG,) 20 MCG/24HR IUD Mirena 20 mcg/24 hr (5 years) intrauterine device     lisdexamfetamine (VYVANSE) 60 MG capsule Take 60 mg by mouth every morning.     predniSONE (DELTASONE) 5 MG tablet Take 1 tablet (  5 mg total) by mouth as directed. sterapred generic taper (Patient not taking: Reported on 02/18/2020) 21 tablet 0   No current facility-administered medications for this visit.   No results found.  Review of Systems:   A ROS was performed including pertinent positives and negatives as documented in the HPI.  Physical Exam :   Constitutional: NAD and appears stated age Neurological: Alert and oriented Psych: Appropriate affect and cooperative unknown if currently breastfeeding.   Comprehensive Musculoskeletal Exam:    Left knee exam demonstrates no obvious deformity, erythema, or warmth.  Mild effusion present.  Active range of motion from 0 to 130 degrees.  Mild tenderness over the lateral joint line.  No  instability with varus or valgus stress.  Negative Lachman and McMurray.  Imaging:   Xray (left knee 4 views): Negative for acute fracture, dislocation, or bony abnormality   I personally reviewed and interpreted the radiographs.   Assessment and Plan:    Knee Injury  A skiing accident has resulted in knee pain, swelling, and limited range of motion. There is no history of previous knee injuries. Physical examination shows lateral tenderness and intact ligament testing, while X-ray reveals no bony abnormalities. Suspicion for and underlying knee sprain so recommend continued conservative management with ibuprofen, ice, and compression. Consider using a low-profile brace for additional support. If symptoms persist or worsen over the next 2-3 weeks, contact the office for further evaluation, potentially including an MRI.    I personally saw and evaluated the patient, and participated in the management and treatment plan.  Hazle Nordmann, PA-C Orthopedics

## 2024-08-24 ENCOUNTER — Encounter: Payer: Self-pay | Admitting: Radiology
